# Patient Record
Sex: Female | Born: 2000 | Race: White | Hispanic: No | Marital: Single | Smoking: Former smoker
Health system: Southern US, Community
[De-identification: ages and names within clinical notes are randomized; demographics above are authoritative.]

---
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
## 2020-03-04 ENCOUNTER — Emergency Department (HOSPITAL_COMMUNITY)
Admission: EM | Admit: 2020-03-04 | Discharge: 2020-03-05 | Disposition: A | Discharge status: Home / Self Care | Payer: BC Managed Care – PPO | Attending: Emergency Medicine | Admitting: Emergency Medicine

## 2020-03-04 ENCOUNTER — Other Ambulatory Visit: Payer: Self-pay

## 2020-03-04 ENCOUNTER — Encounter (HOSPITAL_COMMUNITY): Payer: Self-pay | Admitting: Emergency Medicine

## 2020-03-04 DIAGNOSIS — O219 Vomiting of pregnancy, unspecified: Secondary | ICD-10-CM | POA: Insufficient documentation

## 2020-03-04 DIAGNOSIS — O99891 Other specified diseases and conditions complicating pregnancy: Secondary | ICD-10-CM | POA: Insufficient documentation

## 2020-03-04 DIAGNOSIS — R102 Pelvic and perineal pain: Secondary | ICD-10-CM | POA: Insufficient documentation

## 2020-03-04 DIAGNOSIS — Z87891 Personal history of nicotine dependence: Secondary | ICD-10-CM | POA: Diagnosis not present

## 2020-03-04 DIAGNOSIS — Z3A01 Less than 8 weeks gestation of pregnancy: Secondary | ICD-10-CM | POA: Diagnosis not present

## 2020-03-04 DIAGNOSIS — O209 Hemorrhage in early pregnancy, unspecified: Secondary | ICD-10-CM | POA: Diagnosis not present

## 2020-03-04 LAB — CBC WITH DIFFERENTIAL/PLATELET
Abs Immature Granulocytes: 0.03 10*3/uL (ref 0.00–0.07)
Basophils Absolute: 0 10*3/uL (ref 0.0–0.1)
Basophils Relative: 0 %
Eosinophils Absolute: 0 10*3/uL (ref 0.0–0.5)
Eosinophils Relative: 0 %
HCT: 40.5 % (ref 36.0–46.0)
Hemoglobin: 14.1 g/dL (ref 12.0–15.0)
Immature Granulocytes: 0 %
Lymphocytes Relative: 15 %
Lymphs Abs: 1.6 10*3/uL (ref 0.7–4.0)
MCH: 31.7 pg (ref 26.0–34.0)
MCHC: 34.8 g/dL (ref 30.0–36.0)
MCV: 91 fL (ref 80.0–100.0)
Monocytes Absolute: 0.7 10*3/uL (ref 0.1–1.0)
Monocytes Relative: 6 %
Neutro Abs: 8.8 10*3/uL — ABNORMAL HIGH (ref 1.7–7.7)
Neutrophils Relative %: 79 %
Platelets: 259 10*3/uL (ref 150–400)
RBC: 4.45 MIL/uL (ref 3.87–5.11)
RDW: 11.8 % (ref 11.5–15.5)
WBC: 11.2 10*3/uL — ABNORMAL HIGH (ref 4.0–10.5)
nRBC: 0 % (ref 0.0–0.2)

## 2020-03-04 LAB — I-STAT BETA HCG BLOOD, ED (MC, WL, AP ONLY): I-stat hCG, quantitative: >2000 m[IU]/mL — ABNORMAL HIGH (ref <5)

## 2020-03-04 MED ORDER — SODIUM CHLORIDE 0.9 % IV BOLUS
1000.0000 mL | Freq: Once | INTRAVENOUS | Status: AC
  Administered 2020-03-04: 1000 mL via INTRAVENOUS

## 2020-03-04 NOTE — ED Notes (Signed)
Pt in bed, pt reports that she is pregnant, states that she didn't feel this sick with her first pregnancy, abd soft with bowel sounds, non tender, pt ambulatory to bathroom with steady gait.

## 2020-03-04 NOTE — ED Triage Notes (Signed)
Pt c/o emesis x 3 days. Pt states she has had 2 positive pregnancy tests at home.

## 2020-03-04 NOTE — ED Provider Notes (Signed)
[facility] EMERGENCY DEPARTMENT Provider Note   CSN: [id number] Arrival date & time: 03/04/20  2209     History Chief Complaint  Patient presents with  . Emesis    pregnant    Elizabeth Bishop is a 19 y.o. female G2, P1, 5 weeks 6 days on arrival by LMP, who presents today for evaluation of multiple complaints. She reports that over the past 3 days she has felt sick with nausea, vomiting, abdominal pain, pelvic pain, spotting and bleeding.  She has not had any ultrasound so far this pregnancy.  She denies any fevers.  She states that she has tried ginger ale, and Pedialyte without relief. She reports that anything she attempts to drink she vomits back up including water.  She states that she has an appointment this coming weekend for a elective abortion.  HPI     History reviewed. No pertinent past medical history.  There are no problems to display for this patient.   History reviewed. No pertinent surgical history.   OB History    Gravida  1   Para      Term      Preterm      AB      Living        SAB      TAB      Ectopic      Multiple      Live Births              No family history on file.  Social History   Tobacco Use  . Smoking status: Former Research scientist (life sciences)  . Smokeless tobacco: Never Used  Substance Use Topics  . Alcohol use: Not Currently  . Drug use: Never    Home Medications Prior to Admission medications   Medication Sig Start Date End Date Taking? Authorizing Provider  albuterol (VENTOLIN HFA) 108 (90 Base) MCG/ACT inhaler Inhale 1-2 puffs into the lungs every 6 (six) hours as needed for wheezing or shortness of breath.  12/07/19  Yes [provider]  bismuth subsalicylate (PEPTO BISMOL) 262 MG/15ML suspension Take 30 mLs by mouth every 6 (six) hours as needed.   Yes [provider]  Pedialyte (PEDIALYTE) SOLN Take by mouth once as needed (for electrolytes).   Yes [provider]    Allergies    Patient has  no known allergies.  Review of Systems   Review of Systems  Constitutional: Negative for chills and fever.  HENT: Negative for congestion.   Respiratory: Negative for cough, chest tightness and shortness of breath.   Gastrointestinal: Positive for abdominal pain, nausea and vomiting. Negative for diarrhea.  Genitourinary: Positive for pelvic pain and vaginal bleeding. Negative for dysuria and urgency.  Musculoskeletal: Negative for back pain.  Neurological: Negative for weakness and headaches.  All other systems reviewed and are negative.   Physical Exam Updated Vital Signs BP 106/78   Pulse 73   Temp 98.8 F (37.1 C)   Resp 16   Ht 5\' 2"  (1.575 m)   Wt 81.6 kg   LMP 01/23/2020   SpO2 98%   BMI 32.92 kg/m   Physical Exam Vitals and nursing note reviewed. Exam conducted with a chaperone present (ED tech).  Constitutional:      General: She is not in acute distress.    Appearance: She is well-developed. She is not diaphoretic.  HENT:     Head: Normocephalic and atraumatic.  Eyes:     General: No scleral icterus.  Right eye: No discharge.        Left eye: No discharge.     Conjunctiva/sclera: Conjunctivae normal.  Cardiovascular:     Rate and Rhythm: Normal rate and regular rhythm.     Pulses: Normal pulses.  Pulmonary:     Effort: Pulmonary effort is normal. No respiratory distress.     Breath sounds: No stridor.  Abdominal:     General: There is no distension.     Tenderness: There is no abdominal tenderness. There is no guarding.  Genitourinary:    Comments: Moderate amount of blood in the vaginal canal.  No adnexal tenderness.  No CMT.  Musculoskeletal:        General: No deformity.     Cervical back: Normal range of motion and neck supple.  Skin:    General: Skin is warm and dry.  Neurological:     General: No focal deficit present.     Mental Status: She is alert.     Cranial Nerves: No cranial nerve deficit.     Motor: No abnormal muscle tone.    Psychiatric:        Mood and Affect: Mood normal.        Behavior: Behavior normal.     ED Results / Procedures / Treatments   Labs (all labs ordered are listed, but only abnormal results are displayed) Labs Reviewed  CBC WITH DIFFERENTIAL/PLATELET - Abnormal; Notable for the following components:      Result Value   WBC 11.2 (*)    Neutro Abs 8.8 (*)    All other components within normal limits  COMPREHENSIVE METABOLIC PANEL - Abnormal; Notable for the following components:   Potassium 3.4 (*)    Glucose, Bld 106 (*)    All other components within normal limits  URINALYSIS, ROUTINE W REFLEX MICROSCOPIC - Abnormal; Notable for the following components:   Color, Urine AMBER (*)    APPearance CLOUDY (*)    Specific Gravity, Urine 1.032 (*)    Hgb urine dipstick LARGE (*)    Bilirubin Urine SMALL (*)    Ketones, ur 80 (*)    Protein, ur 100 (*)    Leukocytes,Ua SMALL (*)    Non Squamous Epithelial 0-5 (*)    All other components within normal limits  HCG, QUANTITATIVE, PREGNANCY - Abnormal; Notable for the following components:   hCG, Beta Chain, Quant, S 60,958 (*)    All other components within normal limits  I-STAT BETA HCG BLOOD, ED ([facility], [facility], [facility] ONLY) - Abnormal; Notable for the following components:   I-stat hCG, quantitative >2,000.0 (*)    All other components within normal limits  URINE CULTURE  WET PREP, GENITAL  ABO/RH  GC/CHLAMYDIA PROBE AMP ([place]) NOT AT [facility]    EKG None  Radiology No results found.  Procedures Procedures (including critical care time)  Medications Ordered in ED Medications  sodium chloride 0.9 % bolus 1,000 mL (0 mLs Intravenous Stopped 03/05/20 0057)  metoCLOPramide (REGLAN) injection 10 mg (10 mg Intravenous Given 03/05/20 0111)    ED Course  I have reviewed the triage vital signs and the nursing notes.  Pertinent labs & imaging results that were available during my care of the patient were reviewed by me and considered in  my medical decision making (see chart for details).    MDM Rules/Calculators/A&P                     Patient is a 19 year old  G2, P1 approximately 5 weeks 6 days, who presents today for evaluation of nausea and vomiting.  She also reports abdominal pain, cramping, and spotting over the past 3 days.  hCG quant is 61,000.  UA is significantly contaminated with 21-50 squamous epithelial cells, however there is 80 ketones.  There is also protein but given the early state of pregnancy, along with the contamination do not suspect pre-eclampsia.   She is given 1 L of IV fluids, after which she reported feeling significantly better.  Pelvic exam was performed with a moderate amount of blood in the vaginal canal.  Blood type is a positive, not a RhoGam candidate.  Pelvic ultrasound was obtained, results are pending in addition to wet prep results.  At shift change care was transferred to Dr. Clayborne Dana who will follow pending studies, re-evaulate and determine disposition.     Final Clinical Impression(s) / ED Diagnoses Final diagnoses:  Vaginal bleeding in pregnancy, first trimester  Nausea and vomiting during pregnancy prior to [redacted] weeks gestation    Rx / DC Orders ED Discharge Orders    None       Norman Clay 03/05/20 0135    Marily Memos, MD 03/05/20 [phone]

## 2020-03-05 ENCOUNTER — Other Ambulatory Visit (HOSPITAL_COMMUNITY): Payer: Self-pay | Admitting: Physician Assistant

## 2020-03-05 ENCOUNTER — Emergency Department (HOSPITAL_COMMUNITY): Payer: BC Managed Care – PPO

## 2020-03-05 DIAGNOSIS — O209 Hemorrhage in early pregnancy, unspecified: Secondary | ICD-10-CM

## 2020-03-05 DIAGNOSIS — O219 Vomiting of pregnancy, unspecified: Secondary | ICD-10-CM | POA: Diagnosis not present

## 2020-03-05 LAB — URINALYSIS, ROUTINE W REFLEX MICROSCOPIC
Bacteria, UA: NONE SEEN
Glucose, UA: NEGATIVE mg/dL
Ketones, ur: 80 mg/dL — AB
Nitrite: NEGATIVE
Protein, ur: 100 mg/dL — AB
Specific Gravity, Urine: 1.032 — ABNORMAL HIGH (ref 1.005–1.030)
pH: 5 (ref 5.0–8.0)

## 2020-03-05 LAB — COMPREHENSIVE METABOLIC PANEL
ALT: 28 U/L (ref 0–44)
AST: 23 U/L (ref 15–41)
Albumin: 4.6 g/dL (ref 3.5–5.0)
Alkaline Phosphatase: 63 U/L (ref 38–126)
Anion gap: 11 (ref 5–15)
BUN: 11 mg/dL (ref 6–20)
CO2: 24 mmol/L (ref 22–32)
Calcium: 9.1 mg/dL (ref 8.9–10.3)
Chloride: 105 mmol/L (ref 98–111)
Creatinine, Ser: 0.71 mg/dL (ref 0.44–1.00)
GFR calc Af Amer: >60 mL/min (ref >60)
GFR calc non Af Amer: >60 mL/min (ref >60)
Glucose, Bld: 106 mg/dL — ABNORMAL HIGH (ref 70–99)
Potassium: 3.4 mmol/L — ABNORMAL LOW (ref 3.5–5.1)
Sodium: 140 mmol/L (ref 135–145)
Total Bilirubin: 1 mg/dL (ref 0.3–1.2)
Total Protein: 7.9 g/dL (ref 6.5–8.1)

## 2020-03-05 LAB — WET PREP, GENITAL
Clue Cells Wet Prep HPF POC: NONE SEEN
Sperm: NONE SEEN
Trich, Wet Prep: NONE SEEN
Yeast Wet Prep HPF POC: NONE SEEN

## 2020-03-05 LAB — ABO/RH: ABO/RH(D): O POS

## 2020-03-05 LAB — HCG, QUANTITATIVE, PREGNANCY: hCG, Beta Chain, Quant, S: 60958 m[IU]/mL — ABNORMAL HIGH (ref <5)

## 2020-03-05 MED ORDER — PROMETHAZINE HCL 12.5 MG PO TABS
25.0000 mg | ORAL_TABLET | Freq: Once | ORAL | Status: AC
  Administered 2020-03-05: 25 mg via ORAL
  Filled 2020-03-05: qty 2

## 2020-03-05 MED ORDER — METOCLOPRAMIDE HCL 5 MG/ML IJ SOLN
10.0000 mg | Freq: Once | INTRAMUSCULAR | Status: AC
  Administered 2020-03-05: 10 mg via INTRAVENOUS
  Filled 2020-03-05: qty 2

## 2020-03-05 MED ORDER — PROMETHAZINE HCL 25 MG RE SUPP: 25.0000 mg | Freq: Four times a day (QID) | RECTAL | 0 refills | Status: AC | PRN

## 2020-03-05 MED ORDER — PROMETHAZINE HCL 25 MG PO TABS: 25.0000 mg | ORAL_TABLET | Freq: Four times a day (QID) | ORAL | 0 refills | Status: AC | PRN

## 2020-03-05 NOTE — Discharge Instructions (Addendum)
If you decide to keep the pregnancy, you need to follow up with your obstetrician for repeat ultrasound and labwork. If you decide to keep the pregnancy please don't take the prescribed medications but rather follow these directions: For morning sickness and nausea and vomiting in pregnancy you may take Diclegis.  This is an expensive prescription.  The active ingredients are the same as taking unisom or a similar brand (Doxylamine succinate) and vitamin B6 (Pyridoxine hydrochloride).  Please make sure the active ingredient matches the same name as there are many types of unisom and generic medications.  You may take 12.5 of unisom (or generic) every 4-6 hours, if needed you can take up to 25mg .  Please do not take more than 75mg  in 24 hours.   For the vitamin B6 (pyridoxine) you may take 10mg  up to twice a day.

## 2020-03-05 NOTE — ED Provider Notes (Signed)
Medical screening examination/treatment/procedure(s) were conducted as a shared visit with non-physician practitioner(s) and myself.  I personally evaluated the patient during the encounter.  Patient with multiple complaints to inculde vomiting, abdominal pain, pelvic pain, spotting with pregnancy.  Ultrasound with twins. Symptoms improved. Abdomen benign.  patient plans to have elective abortion so will rx phenergan with the understanding to use diclegis if she decides to keep pregnancy. Also to fu w/ ob if decides to keep pregnancy.  Tolerating PO.  Discharge.         Yumiko Alkins, Barbara Cower, MD 03/05/20 [phone]

## 2020-03-06 LAB — URINE CULTURE: Culture: 10000 — AB

## 2020-03-06 LAB — GC/CHLAMYDIA PROBE AMP (~~LOC~~) NOT AT ARMC
Chlamydia: NEGATIVE
Comment: NEGATIVE
Comment: NORMAL
Neisseria Gonorrhea: NEGATIVE

## 2020-09-22 IMAGING — US US OB EACH ADDL GEST<[ID]
1 series · 13 of 28 positions shown · non-contrast
Comparison: None recent.

CLINICAL DATA: Heavy bleeding.  Cramping.

EXAM:
TWIN OBSTETRICAL ULTRASOUND <14 WKS

[Series 1: us ob each addl gest<(id) · 112 acquisitions, 13 frames shown]
[im 5/112]
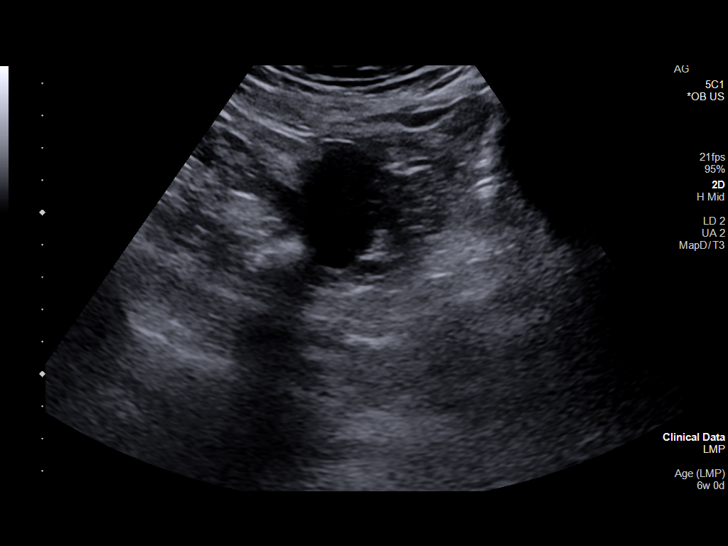
[im 13/112]
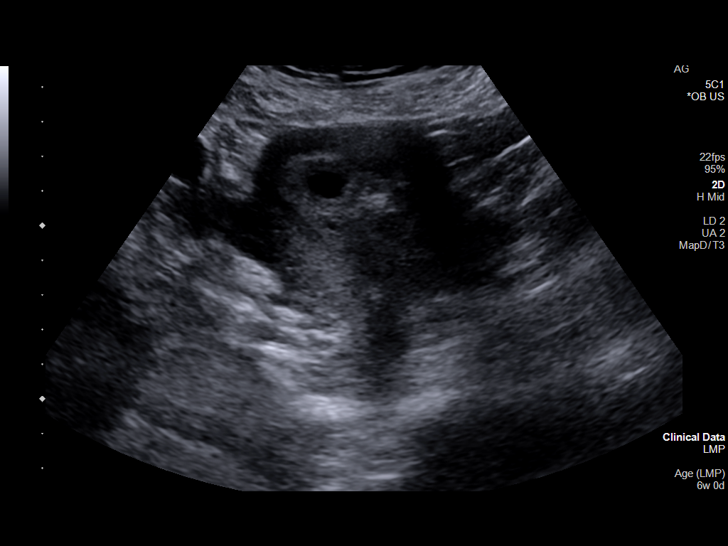
[im 21/112]
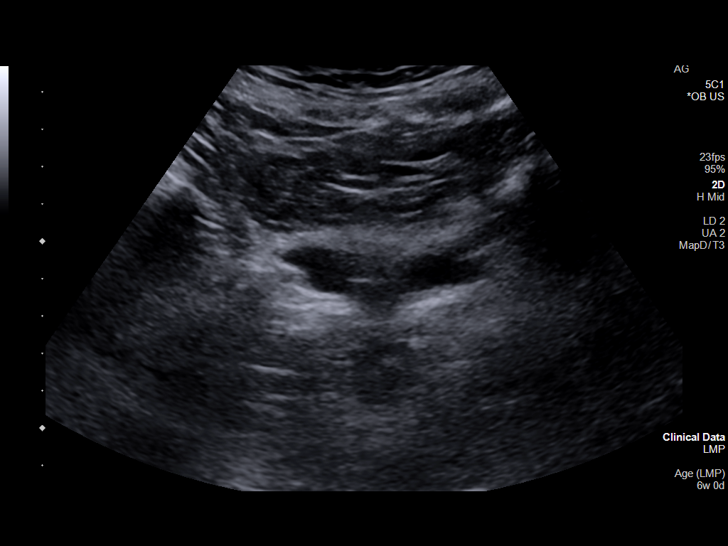
[im 29/112]
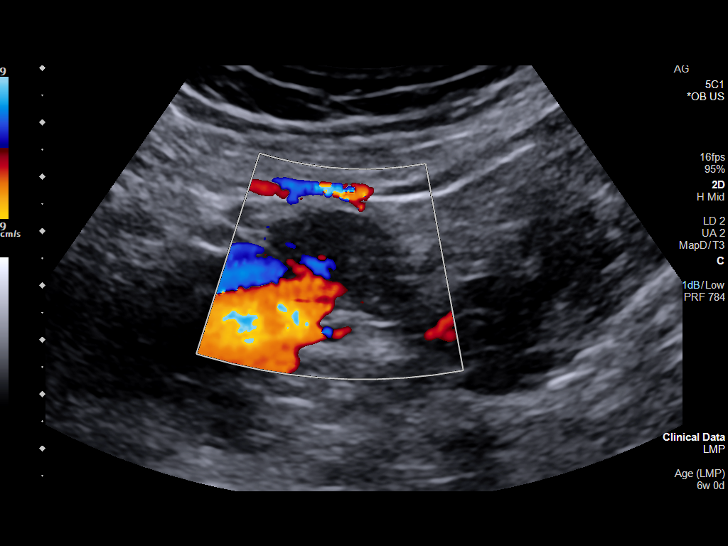
[im 38/112]
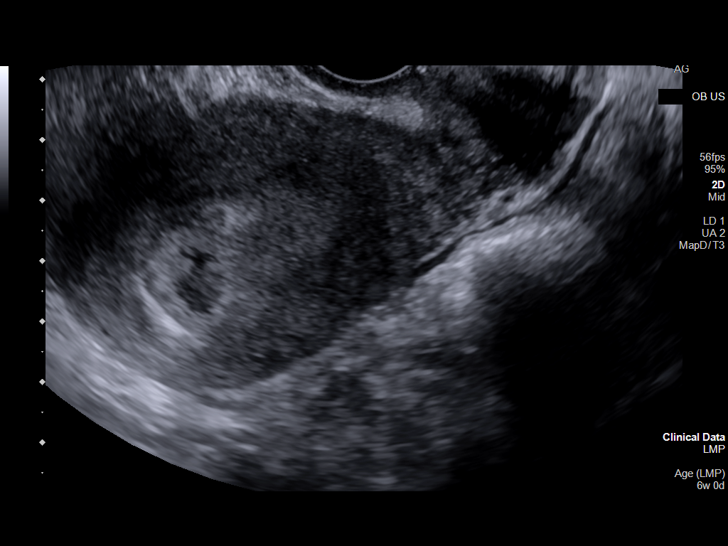
[im 46/112]
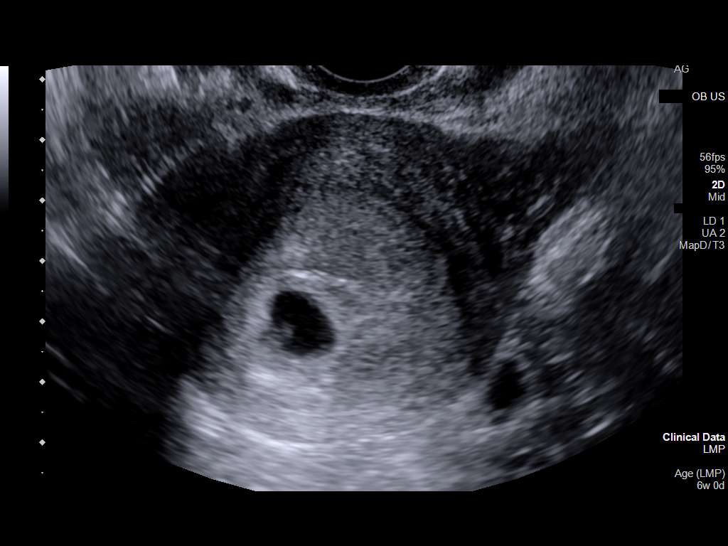
[im 58/112]
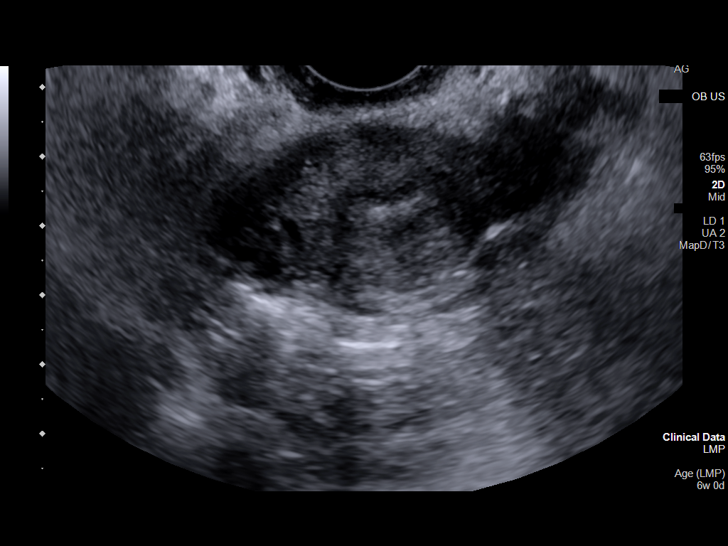
[im 66/112]
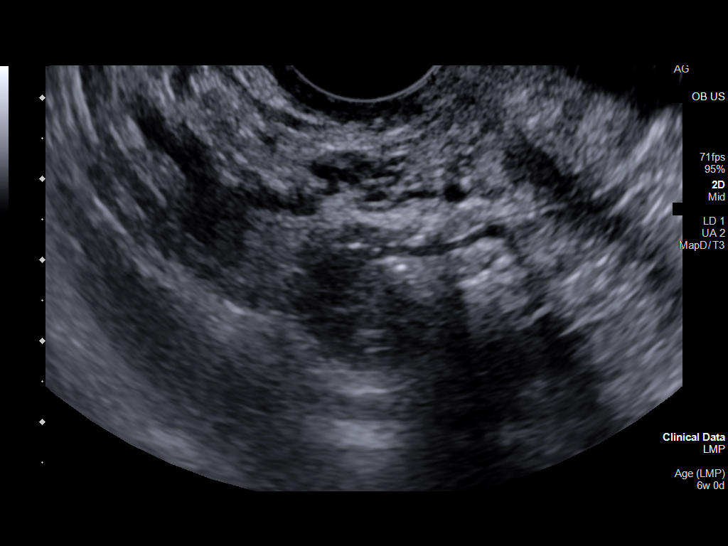
[im 75/112]
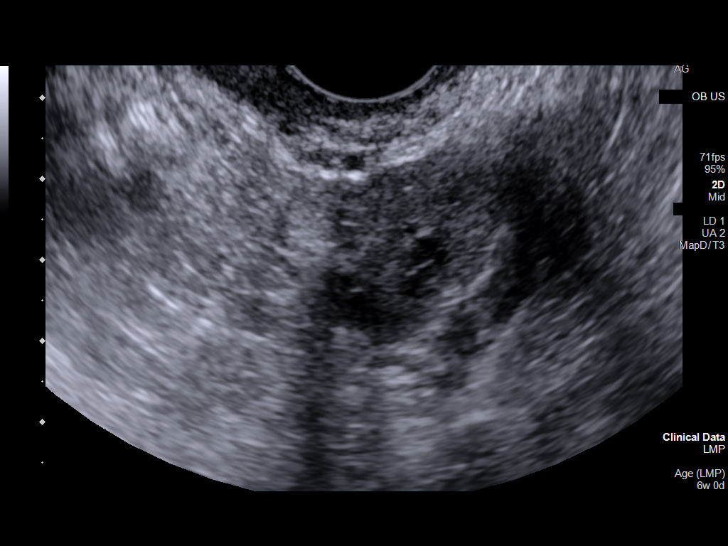
[im 83/112]
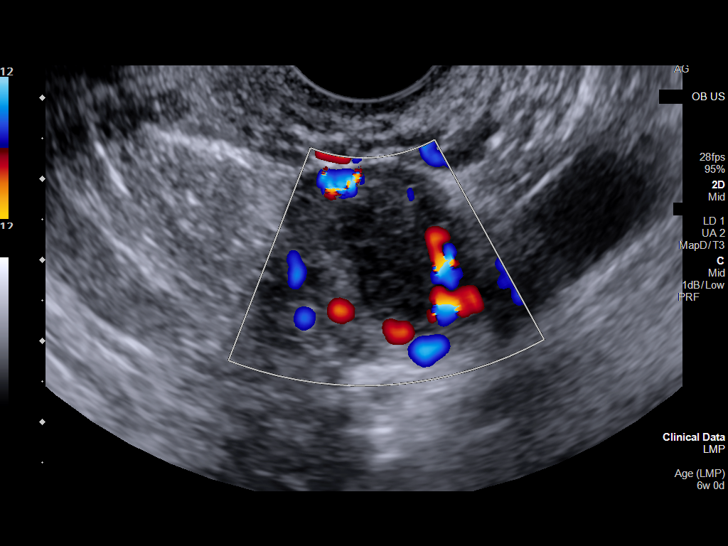
[im 91/112]
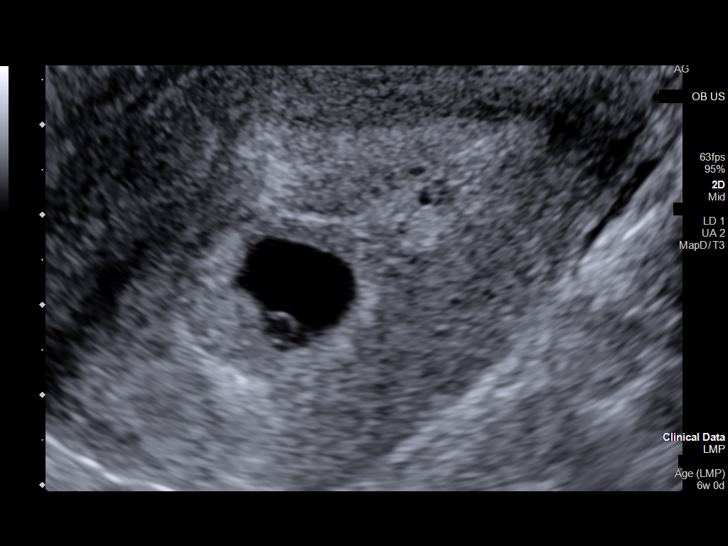
[im 99/112]
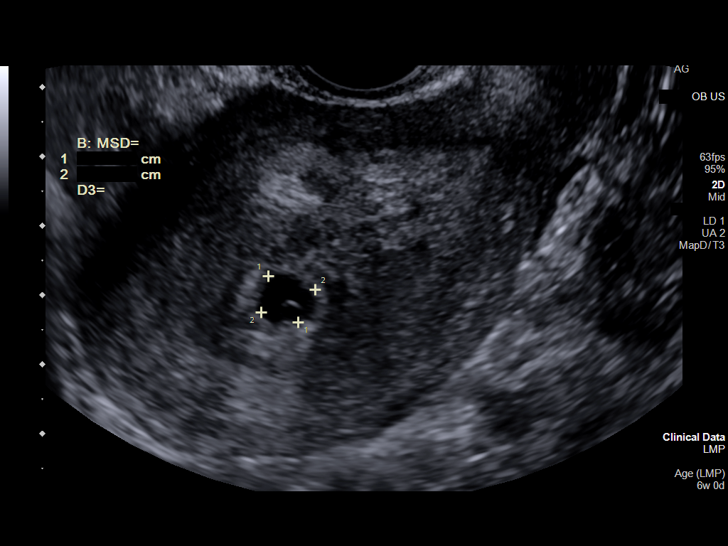
[im 107/112]
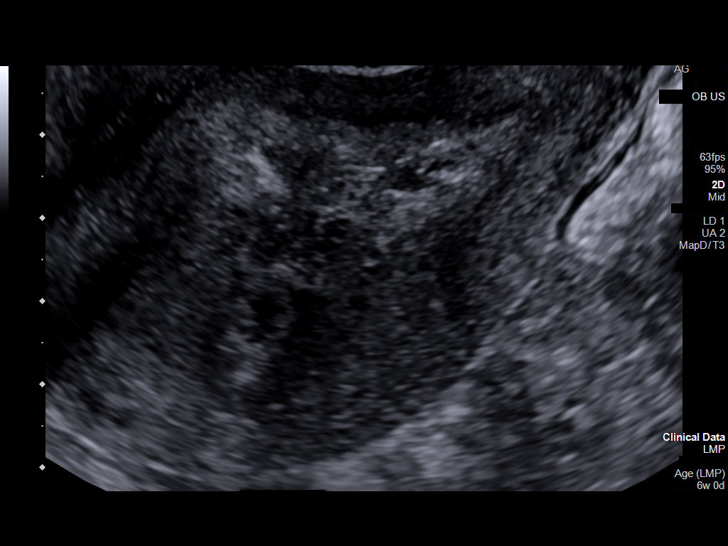

[13 of 28 positions shown; findings below may reference images not displayed]

FINDINGS: Number of IUPs:  2

Chorionicity/Amnionicity:  Dichorionic-diamniotic (thick membrane)

TWIN 1

Yolk sac:  Visualized.

Embryo:  Visualized.

Cardiac Activity:

Heart Rate: 110 bpm

MSD: 13.6 mm   6 6 w   2 d

CRL:   6.7 mm   6 w 6 4 d                  US EDC: 10/25/2020

TWIN 2

Yolk sac:  Visualized.

Embryo:  Not Visualized.

Cardiac Activity: Not Visualized.

MSD: 7.6 mm   5 w   4 d

Subchorionic hemorrhage: There is a moderate volume of subchorionic
hemorrhage.

Maternal uterus/adnexae: The ovaries are grossly unremarkable. There
is no significant free fluid the patient's pelvis.
IMPRESSION: 1. Twin dichorionic/diamniotic pregnancy.
2. Twin #1 demonstrates cardiac activity with an estimated
gestational age at 6 weeks and 4 days.
3. Twin #2 does not demonstrate a embryo or cardiac activity.
Recommend follow-up quantitative B-HCG levels and follow-up US in 14
days to assess viability. This recommendation follows SRU consensus
guidelines: Diagnostic Criteria for Nonviable Pregnancy Early in the
First Trimester. N Engl J Med 7270; [DATE].
4. Moderate volume subchorionic hemorrhage.
5. Of note, a significant amount of blood products was visualized on
the transducer following the examination.

## 2020-09-22 IMAGING — US US OB COMP LESS 14 WK
1 series · 13 of 28 positions shown · non-contrast
Comparison: None recent.

CLINICAL DATA: Heavy bleeding.  Cramping.

EXAM:
TWIN OBSTETRICAL ULTRASOUND <14 WKS

[Series 1: us ob comp less 14 wk · 112 acquisitions, 13 frames shown]
[im 5/112]
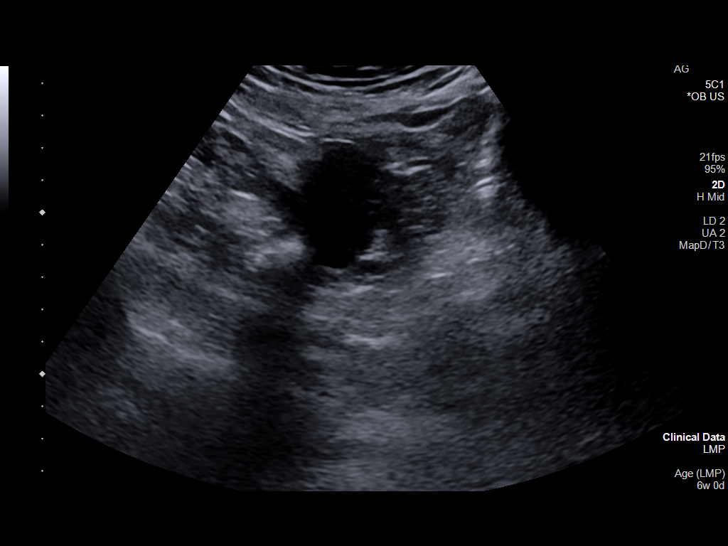
[im 13/112]
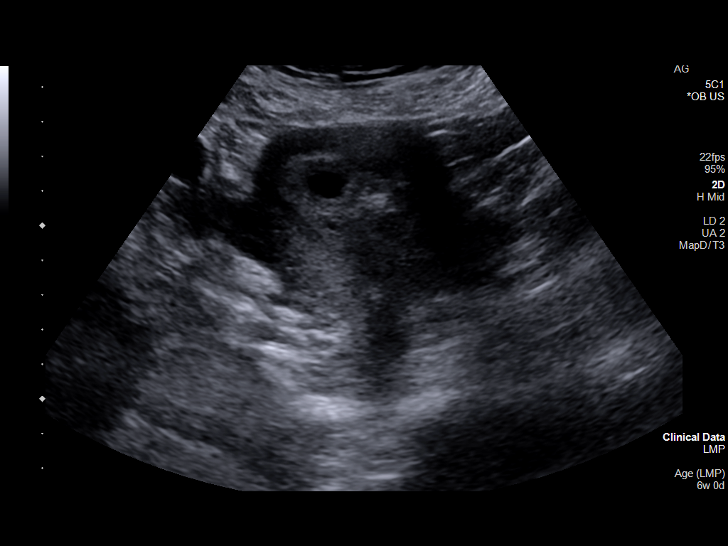
[im 21/112]
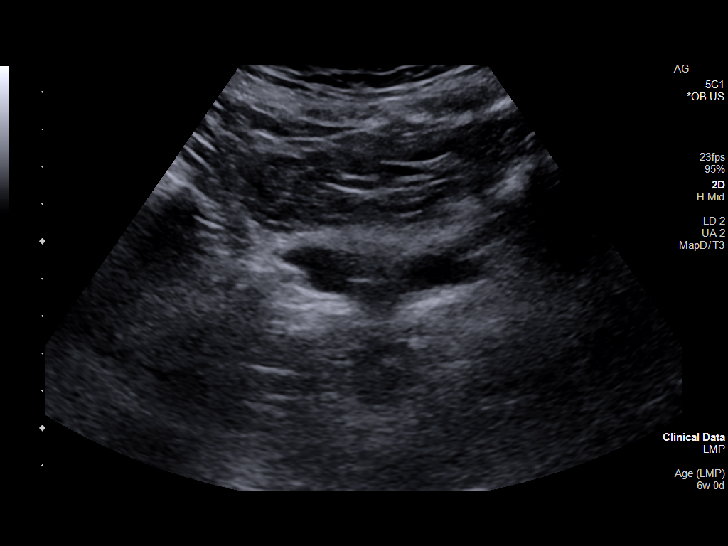
[im 29/112]
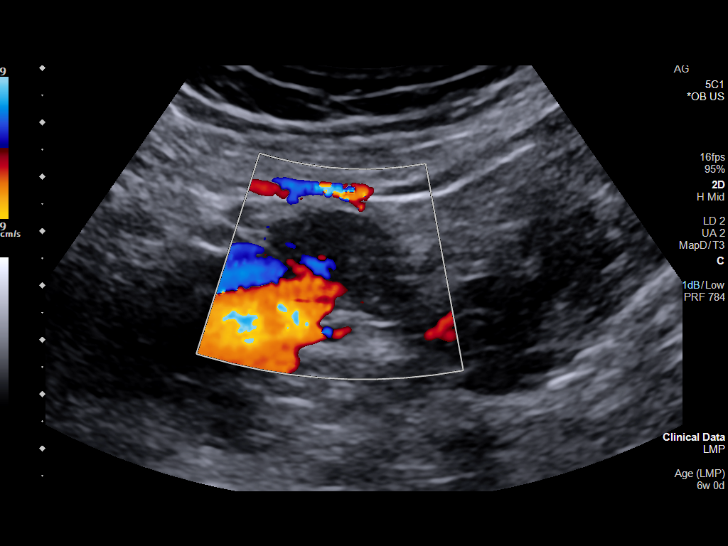
[im 38/112]
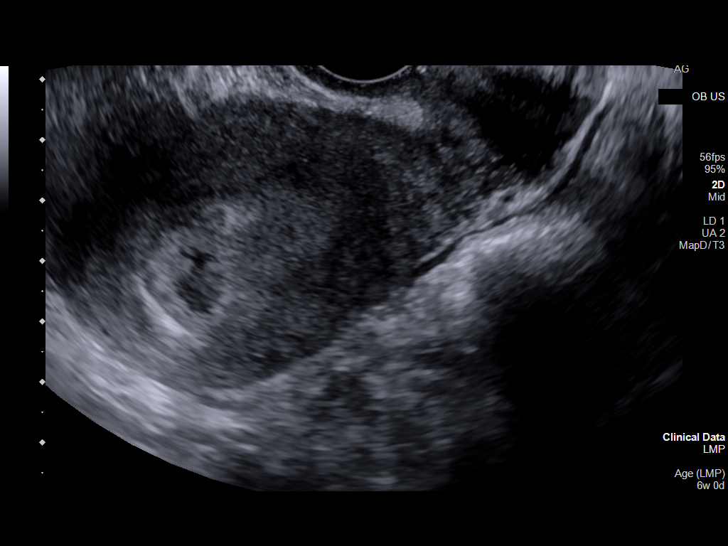
[im 46/112]
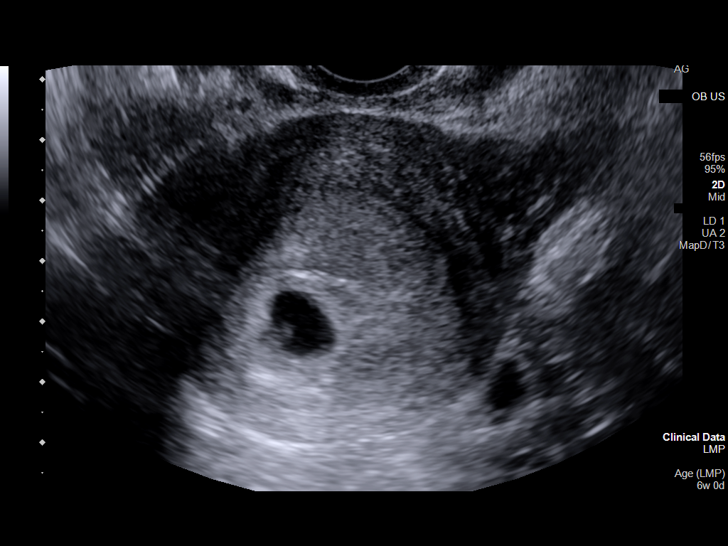
[im 58/112]
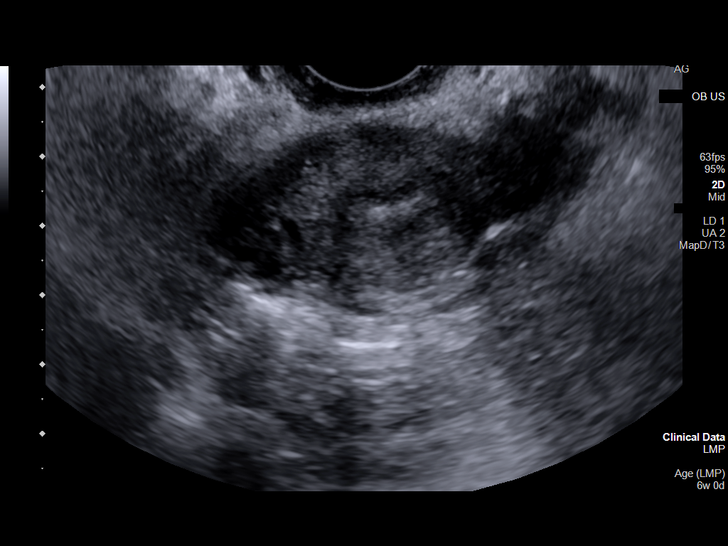
[im 66/112]
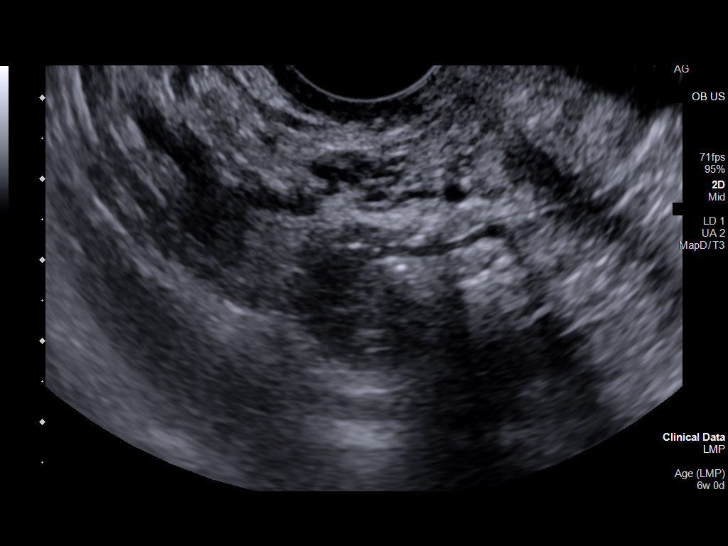
[im 75/112]
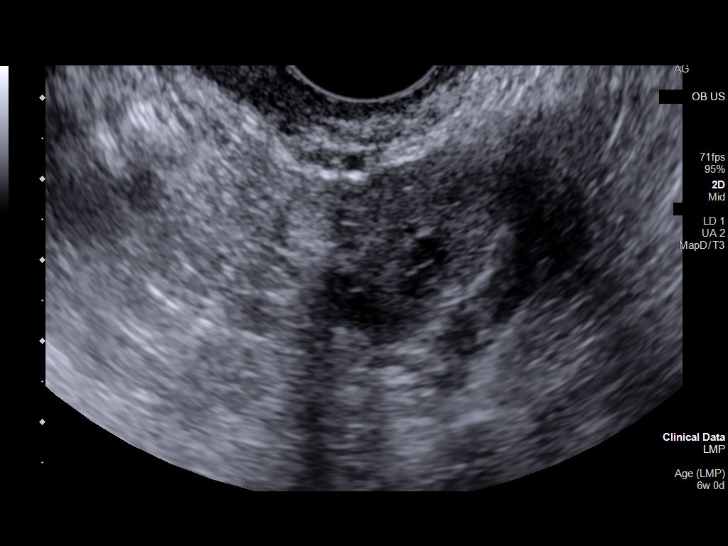
[im 83/112]
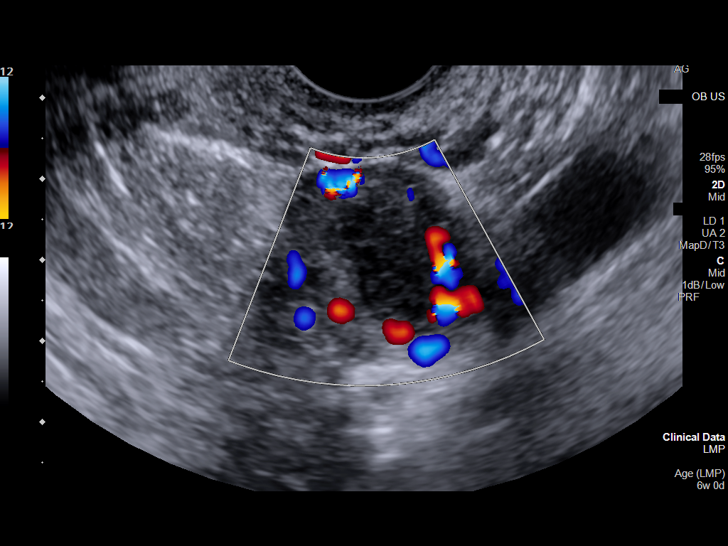
[im 91/112]
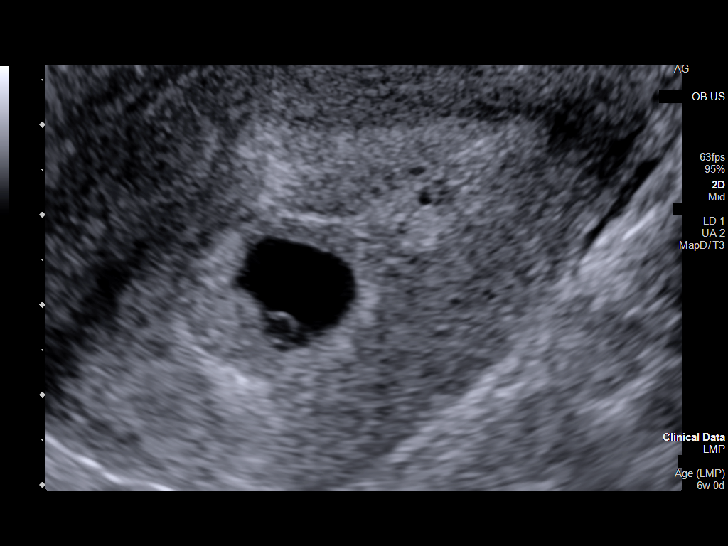
[im 99/112]
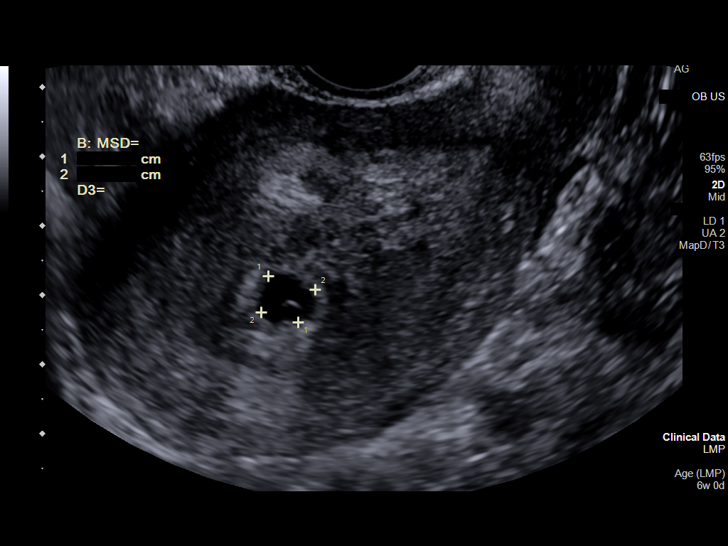
[im 107/112]
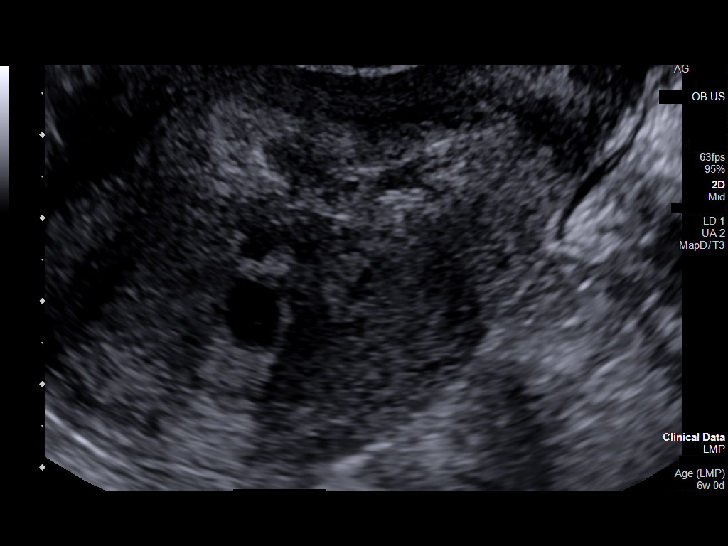

[13 of 28 positions shown; findings below may reference images not displayed]

FINDINGS: Number of IUPs:  2

Chorionicity/Amnionicity:  Dichorionic-diamniotic (thick membrane)

TWIN 1

Yolk sac:  Visualized.

Embryo:  Visualized.

Cardiac Activity:

Heart Rate: 110 bpm

MSD: 13.6 mm   6 6 w   2 d

CRL:   6.7 mm   6 w 6 4 d                  US EDC: 10/25/2020

TWIN 2

Yolk sac:  Visualized.

Embryo:  Not Visualized.

Cardiac Activity: Not Visualized.

MSD: 7.6 mm   5 w   4 d

Subchorionic hemorrhage: There is a moderate volume of subchorionic
hemorrhage.

Maternal uterus/adnexae: The ovaries are grossly unremarkable. There
is no significant free fluid the patient's pelvis.
IMPRESSION: 1. Twin dichorionic/diamniotic pregnancy.
2. Twin #1 demonstrates cardiac activity with an estimated
gestational age at 6 weeks and 4 days.
3. Twin #2 does not demonstrate a embryo or cardiac activity.
Recommend follow-up quantitative B-HCG levels and follow-up US in 14
days to assess viability. This recommendation follows SRU consensus
guidelines: Diagnostic Criteria for Nonviable Pregnancy Early in the
First Trimester. N Engl J Med 7270; [DATE].
4. Moderate volume subchorionic hemorrhage.
5. Of note, a significant amount of blood products was visualized on
the transducer following the examination.

## 2020-09-22 IMAGING — US US OB TRANSVAGINAL
1 series · 13 of 28 positions shown · non-contrast
Comparison: None recent.

CLINICAL DATA: Heavy bleeding.  Cramping.

EXAM:
TWIN OBSTETRICAL ULTRASOUND <14 WKS

[Series 1: us ob transvaginal · 112 acquisitions, 13 frames shown]
[im 5/112]
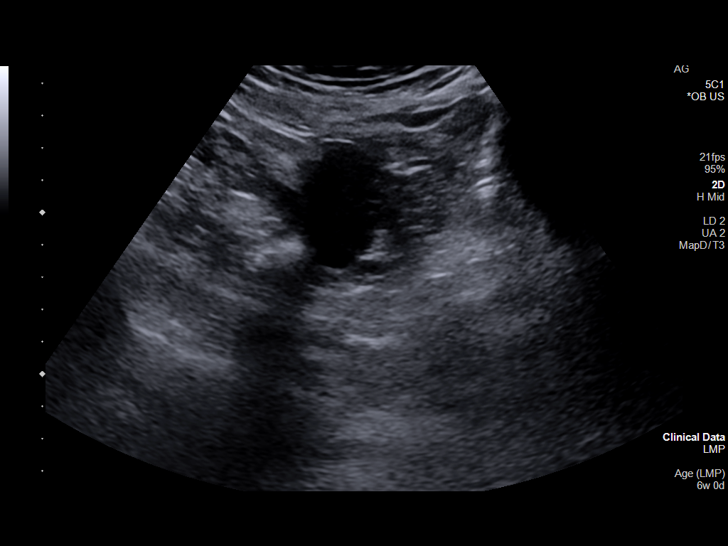
[im 13/112]
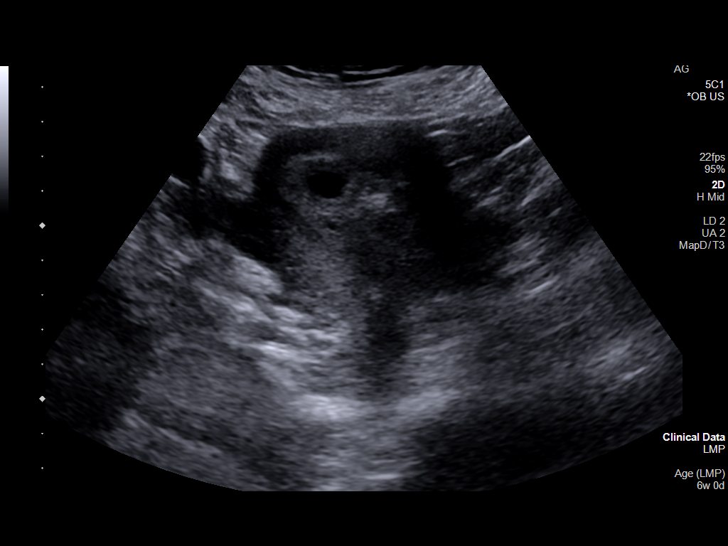
[im 21/112]
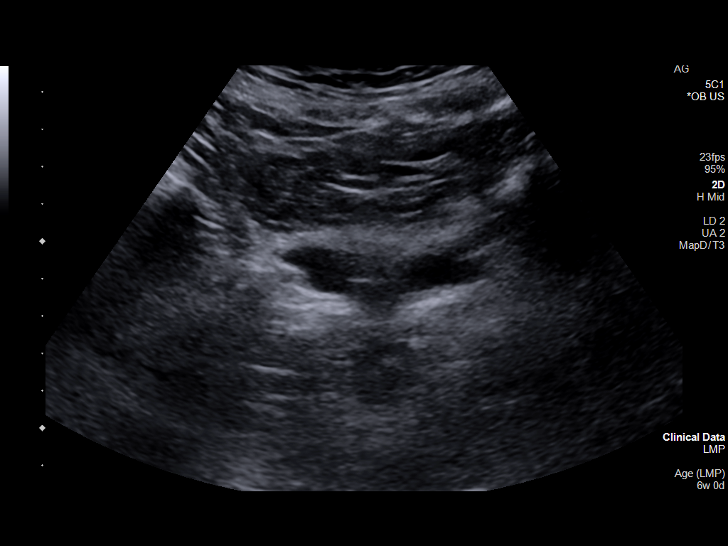
[im 29/112]
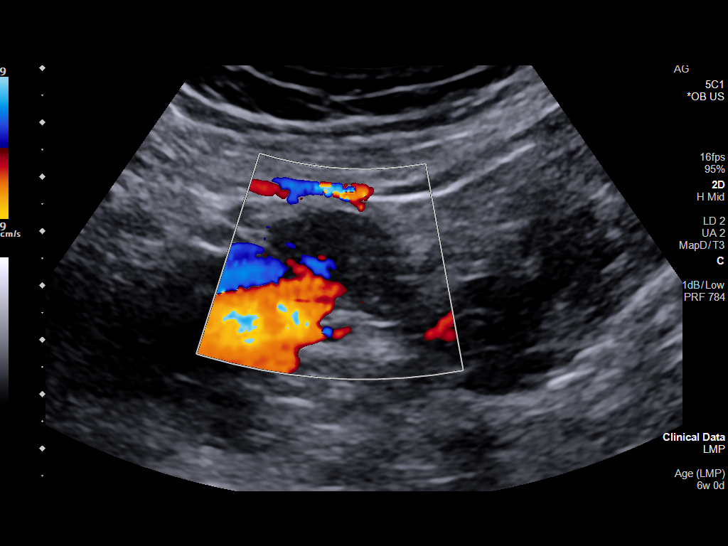
[im 38/112]
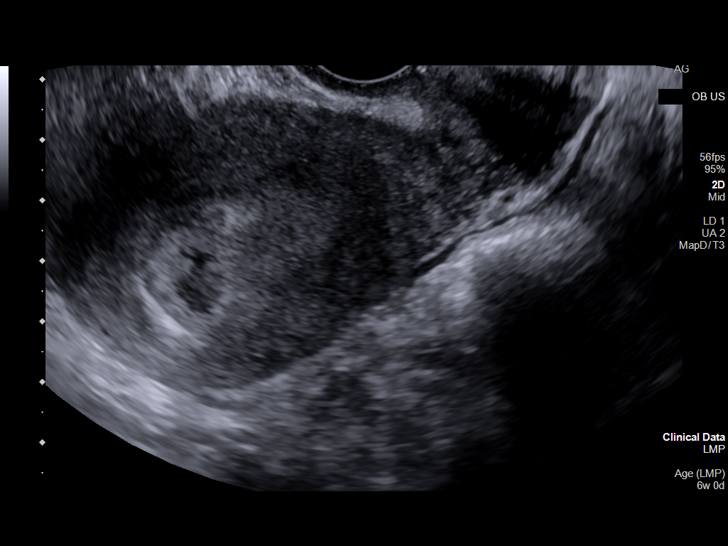
[im 46/112]
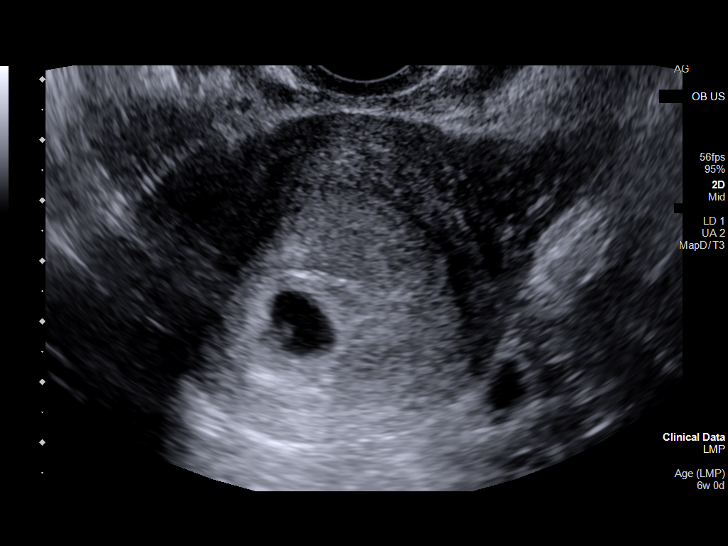
[im 58/112]
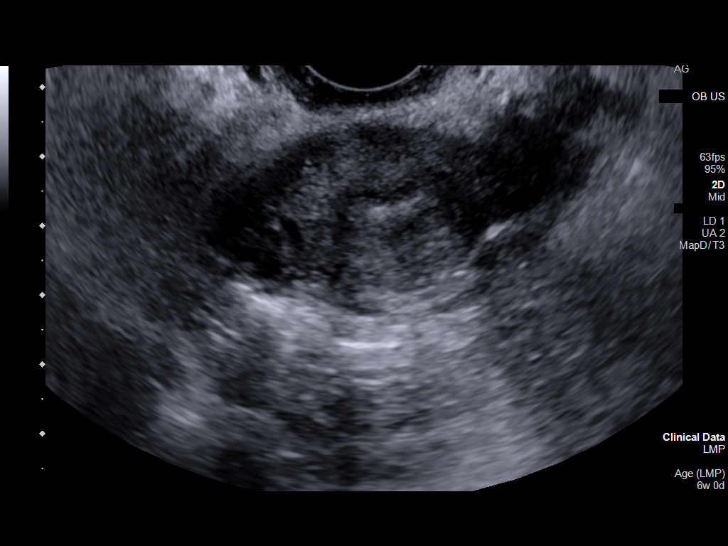
[im 66/112]
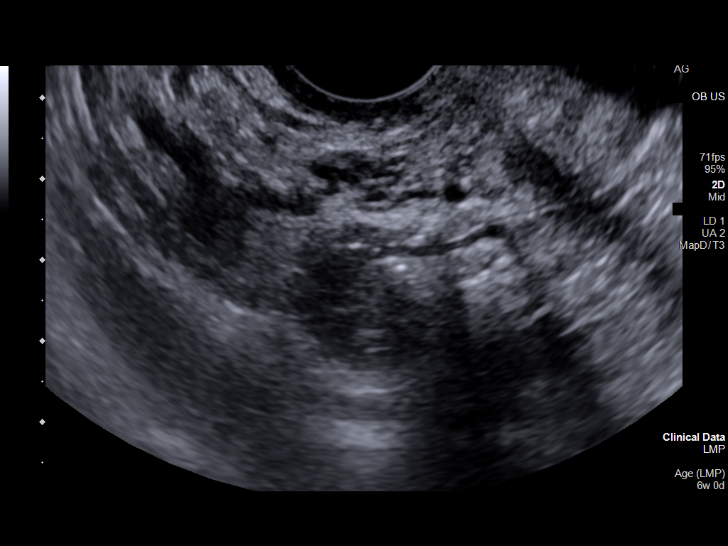
[im 75/112]
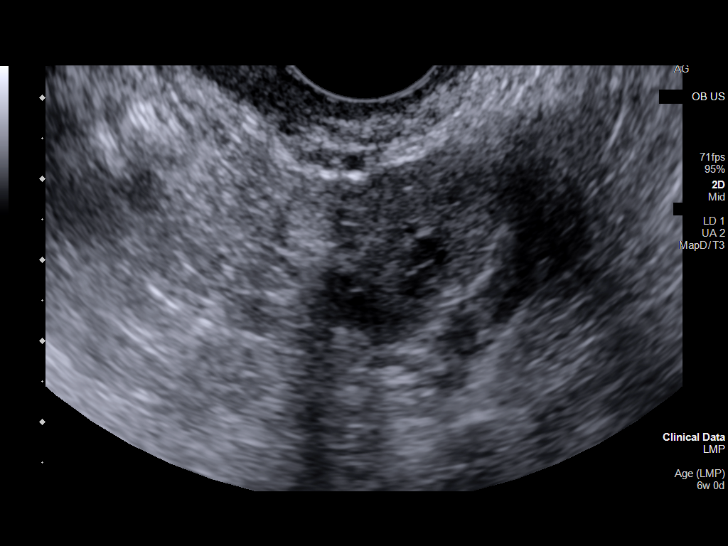
[im 83/112]
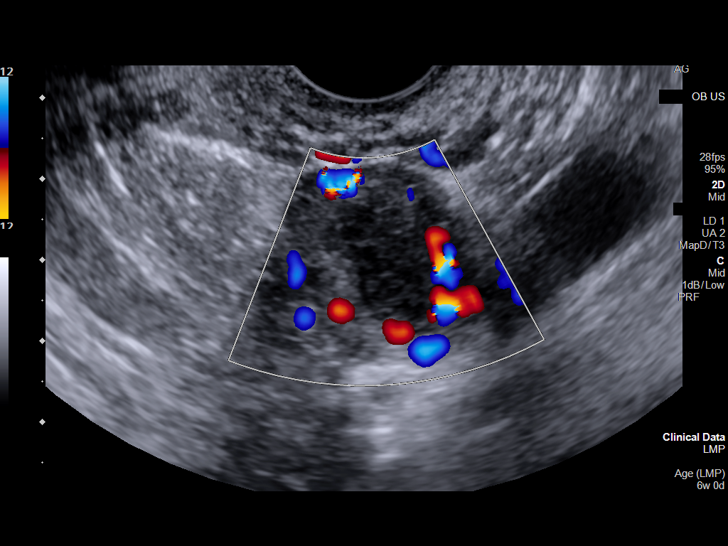
[im 91/112]
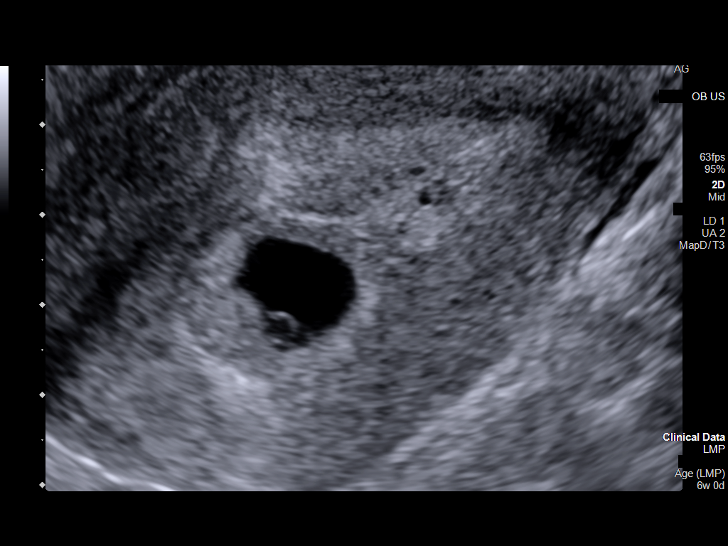
[im 99/112]
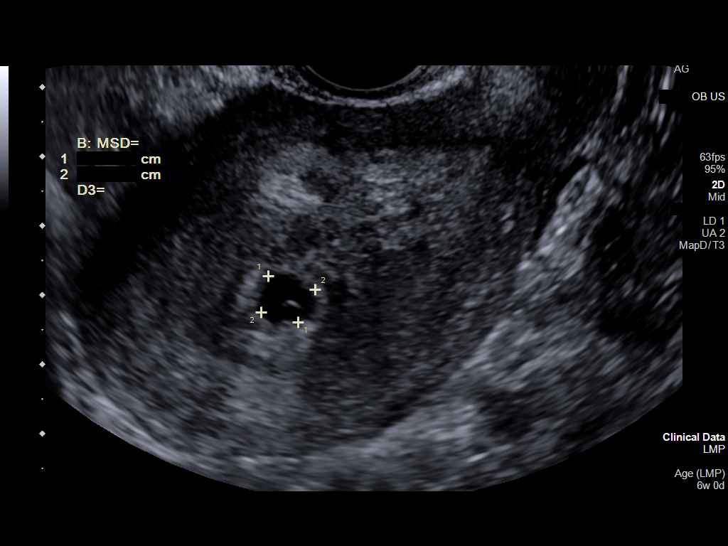
[im 107/112]
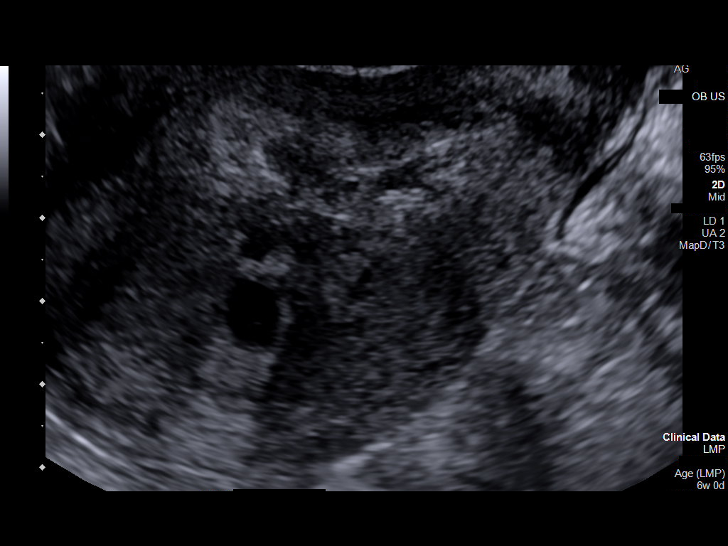

[13 of 28 positions shown; findings below may reference images not displayed]

FINDINGS: Number of IUPs:  2

Chorionicity/Amnionicity:  Dichorionic-diamniotic (thick membrane)

TWIN 1

Yolk sac:  Visualized.

Embryo:  Visualized.

Cardiac Activity:

Heart Rate: 110 bpm

MSD: 13.6 mm   6 6 w   2 d

CRL:   6.7 mm   6 w 6 4 d                  US EDC: 10/25/2020

TWIN 2

Yolk sac:  Visualized.

Embryo:  Not Visualized.

Cardiac Activity: Not Visualized.

MSD: 7.6 mm   5 w   4 d

Subchorionic hemorrhage: There is a moderate volume of subchorionic
hemorrhage.

Maternal uterus/adnexae: The ovaries are grossly unremarkable. There
is no significant free fluid the patient's pelvis.
IMPRESSION: 1. Twin dichorionic/diamniotic pregnancy.
2. Twin #1 demonstrates cardiac activity with an estimated
gestational age at 6 weeks and 4 days.
3. Twin #2 does not demonstrate a embryo or cardiac activity.
Recommend follow-up quantitative B-HCG levels and follow-up US in 14
days to assess viability. This recommendation follows SRU consensus
guidelines: Diagnostic Criteria for Nonviable Pregnancy Early in the
First Trimester. N Engl J Med 7270; [DATE].
4. Moderate volume subchorionic hemorrhage.
5. Of note, a significant amount of blood products was visualized on
the transducer following the examination.
